# Patient Record
Sex: Female | Born: 1945 | Race: White | Hispanic: No | Smoking: Never smoker
Health system: Southern US, Community
[De-identification: ages and names within clinical notes are randomized; demographics above are authoritative.]

## PROBLEM LIST (undated history)

## (undated) DIAGNOSIS — N289 Disorder of kidney and ureter, unspecified: Secondary | ICD-10-CM

## (undated) DIAGNOSIS — E119 Type 2 diabetes mellitus without complications: Secondary | ICD-10-CM

## (undated) DIAGNOSIS — H5440 Blindness, one eye, unspecified eye: Secondary | ICD-10-CM

## (undated) DIAGNOSIS — E079 Disorder of thyroid, unspecified: Secondary | ICD-10-CM

## (undated) DIAGNOSIS — I10 Essential (primary) hypertension: Secondary | ICD-10-CM

## (undated) HISTORY — PX: THYROID SURGERY: SHX805

## (undated) HISTORY — PX: ABDOMINAL HYSTERECTOMY: SHX81

## (undated) HISTORY — PX: EYE SURGERY: SHX253

---

## 2021-01-19 ENCOUNTER — Emergency Department (HOSPITAL_BASED_OUTPATIENT_CLINIC_OR_DEPARTMENT_OTHER)
Admission: EM | Admit: 2021-01-19 | Discharge: 2021-01-19 | Disposition: A | Discharge status: Home / Self Care | Payer: Medicare PPO | Attending: Emergency Medicine | Admitting: Emergency Medicine

## 2021-01-19 ENCOUNTER — Other Ambulatory Visit: Payer: Self-pay

## 2021-01-19 ENCOUNTER — Emergency Department (HOSPITAL_BASED_OUTPATIENT_CLINIC_OR_DEPARTMENT_OTHER): Payer: Medicare PPO

## 2021-01-19 ENCOUNTER — Encounter (HOSPITAL_BASED_OUTPATIENT_CLINIC_OR_DEPARTMENT_OTHER): Payer: Self-pay

## 2021-01-19 DIAGNOSIS — Y9301 Activity, walking, marching and hiking: Secondary | ICD-10-CM | POA: Insufficient documentation

## 2021-01-19 DIAGNOSIS — T148XXA Other injury of unspecified body region, initial encounter: Secondary | ICD-10-CM

## 2021-01-19 DIAGNOSIS — Z79899 Other long term (current) drug therapy: Secondary | ICD-10-CM | POA: Diagnosis not present

## 2021-01-19 DIAGNOSIS — S4991XA Unspecified injury of right shoulder and upper arm, initial encounter: Secondary | ICD-10-CM | POA: Diagnosis present

## 2021-01-19 DIAGNOSIS — S46911A Strain of unspecified muscle, fascia and tendon at shoulder and upper arm level, right arm, initial encounter: Secondary | ICD-10-CM | POA: Diagnosis not present

## 2021-01-19 DIAGNOSIS — S0081XA Abrasion of other part of head, initial encounter: Secondary | ICD-10-CM | POA: Diagnosis not present

## 2021-01-19 DIAGNOSIS — Z7982 Long term (current) use of aspirin: Secondary | ICD-10-CM | POA: Insufficient documentation

## 2021-01-19 DIAGNOSIS — E119 Type 2 diabetes mellitus without complications: Secondary | ICD-10-CM | POA: Insufficient documentation

## 2021-01-19 DIAGNOSIS — Z794 Long term (current) use of insulin: Secondary | ICD-10-CM | POA: Diagnosis not present

## 2021-01-19 DIAGNOSIS — I10 Essential (primary) hypertension: Secondary | ICD-10-CM | POA: Diagnosis not present

## 2021-01-19 DIAGNOSIS — Y92008 Other place in unspecified non-institutional (private) residence as the place of occurrence of the external cause: Secondary | ICD-10-CM | POA: Diagnosis not present

## 2021-01-19 DIAGNOSIS — W1839XA Other fall on same level, initial encounter: Secondary | ICD-10-CM | POA: Insufficient documentation

## 2021-01-19 DIAGNOSIS — W19XXXA Unspecified fall, initial encounter: Secondary | ICD-10-CM

## 2021-01-19 DIAGNOSIS — R519 Headache, unspecified: Secondary | ICD-10-CM | POA: Diagnosis not present

## 2021-01-19 DIAGNOSIS — Z7984 Long term (current) use of oral hypoglycemic drugs: Secondary | ICD-10-CM | POA: Diagnosis not present

## 2021-01-19 HISTORY — DX: Essential (primary) hypertension: I10

## 2021-01-19 HISTORY — DX: Blindness, one eye, unspecified eye: H54.40

## 2021-01-19 HISTORY — DX: Disorder of thyroid, unspecified: E07.9

## 2021-01-19 HISTORY — DX: Disorder of kidney and ureter, unspecified: N28.9

## 2021-01-19 HISTORY — DX: Type 2 diabetes mellitus without complications: E11.9

## 2021-01-19 MED ORDER — ACETAMINOPHEN 325 MG PO TABS
650.0000 mg | ORAL_TABLET | Freq: Once | ORAL | Status: AC
  Administered 2021-01-19: 650 mg via ORAL
  Filled 2021-01-19: qty 2

## 2021-01-19 NOTE — ED Provider Notes (Signed)
[facility] EMERGENCY DEPARTMENT Provider Note   CSN: [id number] Arrival date & time: 01/19/21  1249     History Chief Complaint  Patient presents with  . Fall    Jessica Best is a 75 y.o. female.  Patient fell while walking her dog today.  Dog pulled on her while she was walking in the dog holding chain with her right hand.  Pain mostly in her right upper arm.  Hit the right side of her face and head when she fell as well.  No loss of consciousness.  Not on blood thinners.  Patient is blind in the right eye.  Denies any shortness of breath.  No lower leg tenderness.  Was ambulatory afterwards.  The history is provided by the patient.  Fall This is a new problem. The current episode started less than 1 hour ago. Pertinent negatives include no chest pain, no abdominal pain, no headaches and no shortness of breath. Exacerbated by: pain. Nothing relieves the symptoms. She has tried nothing for the symptoms. The treatment provided no relief.       Past Medical History:  Diagnosis Date  . Blindness of one eye    right   . Diabetes mellitus without complication (HCC)   . Hypertension   . Renal disorder    one kidney   . Thyroid disease     There are no problems to display for this patient.   Past Surgical History:  Procedure Laterality Date  . ABDOMINAL HYSTERECTOMY    . EYE SURGERY    . THYROID SURGERY       OB History   No obstetric history on file.     No family history on file.  Social History   Tobacco Use  . Smoking status: Never Smoker  . Smokeless tobacco: Never Used  Substance Use Topics  . Alcohol use: Never  . Drug use: Never    Home Medications Prior to Admission medications   Medication Sig Start Date End Date Taking? Authorizing Provider  amLODipine (NORVASC) 5 MG tablet Take 5 mg by mouth daily.   Yes [provider]  aspirin 81 MG chewable tablet Chew by mouth daily.   Yes [provider]  escitalopram  (LEXAPRO) 10 MG tablet Take 10 mg by mouth daily.   Yes [provider]  ezetimibe (ZETIA) 10 MG tablet Take 10 mg by mouth daily.   Yes [provider]  famotidine (PEPCID) 20 MG tablet Take 20 mg by mouth 2 (two) times daily.   Yes [provider]  glipiZIDE (GLUCOTROL XL) 10 MG 24 hr tablet Take 10 mg by mouth daily with breakfast.   Yes [provider]  hydrALAZINE (APRESOLINE) 50 MG tablet Take 50 mg by mouth 3 (three) times daily.   Yes [provider]  levothyroxine (SYNTHROID) 50 MCG tablet Take 50 mcg by mouth daily before breakfast.   Yes [provider]  liraglutide (VICTOZA) 18 MG/3ML SOPN Inject 15 mg into the skin.   Yes [provider]  losartan (COZAAR) 100 MG tablet Take 100 mg by mouth daily.   Yes [provider]  pravastatin (PRAVACHOL) 40 MG tablet Take 40 mg by mouth daily.   Yes [provider]  sodium bicarbonate 650 MG tablet Take 650 mg by mouth 4 (four) times daily.   Yes [provider]    Allergies    Patient has no known allergies.  Review of Systems   Review of Systems  Constitutional: Negative for chills and fever.  HENT: Negative for ear pain and sore throat.   Eyes: Negative for pain and visual disturbance.  Respiratory: Negative for cough and shortness of breath.   Cardiovascular: Negative for chest pain and palpitations.  Gastrointestinal: Negative for abdominal pain and vomiting.  Genitourinary: Negative for dysuria and hematuria.  Musculoskeletal: Positive for arthralgias. Negative for back pain.  Skin: Positive for wound. Negative for color change and rash.  Neurological: Negative for dizziness, seizures, syncope, facial asymmetry, speech difficulty, weakness, light-headedness, numbness and headaches.  All other systems reviewed and are negative.   Physical Exam Updated Vital Signs BP (!) 158/77 (BP Location: Left Arm)   Pulse 76   Temp 98.8 F (37.1 C)  (Oral)   Resp 16   Ht 5\' 4"  (1.626 m)   Wt 95.3 kg   SpO2 98%   BMI 36.05 kg/m   Physical Exam Vitals and nursing note reviewed.  Constitutional:      General: She is not in acute distress.    Appearance: She is well-developed and well-nourished. She is not ill-appearing.  HENT:     Head: Normocephalic and atraumatic.     Nose: Nose normal.  Eyes:     Conjunctiva/sclera: Conjunctivae normal.     Comments: Blind in the right eye, pupils equal and reactive on the left  Cardiovascular:     Rate and Rhythm: Normal rate and regular rhythm.     Pulses: Normal pulses.     Heart sounds: Normal heart sounds. No murmur heard.   Pulmonary:     Effort: Pulmonary effort is normal. No respiratory distress.     Breath sounds: Normal breath sounds.  Abdominal:     Palpations: Abdomen is soft.     Tenderness: There is no abdominal tenderness.  Musculoskeletal:        General: Tenderness present. No swelling, deformity or edema.     Cervical back: Normal range of motion and neck supple. No tenderness.     Comments: No midline spinal tenderness, tenderness to the right shoulder with decreased range of motion with abduction in of the right upper arm  Skin:    General: Skin is warm and dry.     Capillary Refill: Capillary refill takes less than 2 seconds.     Comments: Abrasions to right side of the face  Neurological:     General: No focal deficit present.     Mental Status: She is alert and oriented to person, place, and time.     Cranial Nerves: No cranial nerve deficit.     Sensory: No sensory deficit.     Motor: No weakness.  Psychiatric:        Mood and Affect: Mood and affect normal.     ED Results / Procedures / Treatments   Labs (all labs ordered are listed, but only abnormal results are displayed) Labs Reviewed - No data to display  EKG None  Radiology DG Shoulder Right  Result Date: 01/19/2021 CLINICAL DATA:  Onset right shoulder pain today when a dog she was  walking jerked the patient's shoulder. Initial encounter. EXAM: RIGHT SHOULDER - 2+ VIEW COMPARISON:  None. FINDINGS: There is no acute bony or joint abnormality. 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 Glenohumeral joint appears normal. Calcific rotator cuff tendinopathy noted. IMPRESSION: No acute abnormality. Calcific rotator cuff tendinopathy. Moderate acromioclavicular osteoarthritis. Electronically Signed   By: Drusilla Kannerhomas  Dalessio M.D.   On: 01/19/2021 14:12   CT Head Wo Contrast  Result Date: 01/19/2021 CLINICAL DATA:  Head trauma. EXAM: CT HEAD WITHOUT CONTRAST CT MAXILLOFACIAL WITHOUT CONTRAST CT CERVICAL SPINE WITHOUT CONTRAST TECHNIQUE: Multidetector CT imaging of the head, cervical spine, and maxillofacial structures were performed using the standard protocol without intravenous contrast. Multiplanar CT image reconstructions of the cervical spine and maxillofacial structures were also generated. COMPARISON:  None. FINDINGS: CT HEAD FINDINGS Brain: Bifrontal atrophy. Ventricle size normal. Negative for acute infarct, hemorrhage, mass. Benign-appearing calcification in the subarachnoid space over the left convexity. Vascular: Negative for hyperdense vessel Skull: Negative Other: None CT MAXILLOFACIAL FINDINGS Osseous: Negative for fracture. Degenerative change in the TMJ bilaterally. Orbits: Paranasal sinuses clear. Contracted and calcified right globe. Left globe intact with left cataract extraction Sinuses: Clear Soft tissues: Negative for soft tissue swelling or hematoma. CT CERVICAL SPINE FINDINGS Alignment: Mild anterolisthesis C4-5 Skull base and vertebrae: Negative for fracture Soft tissues and spinal canal: Atherosclerotic calcification carotid artery bilaterally. Coarse calcifications in the thyroid bilaterally. 1 cm left thyroid nodule. No further imaging necessary. Disc levels: Disc degeneration and spurring most prominent at C5-6 and C6-7. Mild left foraminal narrowing C5-6 and  C6-7. Upper chest: Lung apices clear bilaterally. Other: None IMPRESSION: 1. No acute intracranial abnormality 2. Negative for facial fracture 3. Negative for cervical spine fracture. Electronically Signed   By: Marlan Palau M.D.   On: 01/19/2021 14:11   CT Cervical Spine Wo Contrast  Result Date: 01/19/2021 CLINICAL DATA:  Head trauma. EXAM: CT HEAD WITHOUT CONTRAST CT MAXILLOFACIAL WITHOUT CONTRAST CT CERVICAL SPINE WITHOUT CONTRAST TECHNIQUE: Multidetector CT imaging of the head, cervical spine, and maxillofacial structures were performed using the standard protocol without intravenous contrast. Multiplanar CT image reconstructions of the cervical spine and maxillofacial structures were also generated. COMPARISON:  None. FINDINGS: CT HEAD FINDINGS Brain: Bifrontal atrophy. Ventricle size normal. Negative for acute infarct, hemorrhage, mass. Benign-appearing calcification in the subarachnoid space over the left convexity. Vascular: Negative for hyperdense vessel Skull: Negative Other: None CT MAXILLOFACIAL FINDINGS Osseous: Negative for fracture. Degenerative change in the TMJ bilaterally. Orbits: Paranasal sinuses clear. Contracted and calcified right globe. Left globe intact with left cataract extraction Sinuses: Clear Soft tissues: Negative for soft tissue swelling or hematoma. CT CERVICAL SPINE FINDINGS Alignment: Mild anterolisthesis C4-5 Skull base and vertebrae: Negative for fracture Soft tissues and spinal canal: Atherosclerotic calcification carotid artery bilaterally. Coarse calcifications in the thyroid bilaterally. 1 cm left thyroid nodule. No further imaging necessary. Disc levels: Disc degeneration and spurring most prominent at C5-6 and C6-7. Mild left foraminal narrowing C5-6 and C6-7. Upper chest: Lung apices clear bilaterally. Other: None IMPRESSION: 1. No acute intracranial abnormality 2. Negative for facial fracture 3. Negative for cervical spine fracture. Electronically Signed   By:  Marlan Palau M.D.   On: 01/19/2021 14:11   CT Maxillofacial Wo Contrast  Result Date: 01/19/2021 CLINICAL DATA:  Head trauma. EXAM: CT HEAD WITHOUT CONTRAST CT MAXILLOFACIAL WITHOUT CONTRAST CT CERVICAL SPINE WITHOUT CONTRAST TECHNIQUE: Multidetector CT imaging of the head, cervical spine, and maxillofacial structures were performed using the standard protocol without intravenous contrast. Multiplanar CT image reconstructions of the cervical spine and maxillofacial structures were also generated. COMPARISON:  None. FINDINGS: CT HEAD FINDINGS Brain: Bifrontal atrophy. Ventricle size normal. Negative for acute infarct, hemorrhage, mass. Benign-appearing calcification in the subarachnoid space over the left convexity. Vascular: Negative for hyperdense vessel Skull: Negative Other: None CT MAXILLOFACIAL FINDINGS Osseous: Negative for fracture. Degenerative change in the TMJ bilaterally. Orbits: Paranasal sinuses clear. Contracted and calcified right globe.  Left globe intact with left cataract extraction Sinuses: Clear Soft tissues: Negative for soft tissue swelling or hematoma. CT CERVICAL SPINE FINDINGS Alignment: Mild anterolisthesis C4-5 Skull base and vertebrae: Negative for fracture Soft tissues and spinal canal: Atherosclerotic calcification carotid artery bilaterally. Coarse calcifications in the thyroid bilaterally. 1 cm left thyroid nodule. No further imaging necessary. Disc levels: Disc degeneration and spurring most prominent at C5-6 and C6-7. Mild left foraminal narrowing C5-6 and C6-7. Upper chest: Lung apices clear bilaterally. Other: None IMPRESSION: 1. No acute intracranial abnormality 2. Negative for facial fracture 3. Negative for cervical spine fracture. Electronically Signed   By: Marlan Palau M.D.   On: 01/19/2021 14:11    Procedures Procedures   Medications Ordered in ED Medications  acetaminophen (TYLENOL) tablet 650 mg (650 mg Oral Given 01/19/21 1359)    ED Course  I have  reviewed the triage vital signs and the nursing notes.  Pertinent labs & imaging results that were available during my care of the patient were reviewed by me and considered in my medical decision making (see chart for details).    MDM Rules/Calculators/A&P                          Jessica Best is a 75 year old female who presents to the ED after a fall.  Normal vitals.  No fever.  Patient fell while walking her dog who pulled on leash.  Pain mostly in the right side of the face, head, right shoulder.  No obvious deformity.  Shoulder appears located on exam.  Could be rotator cuff injury versus proximal humerus fracture.  Will get x-ray of the shoulder as well as CT scan of the head, face, neck.  She has no midline spinal tenderness.  No lower extremity tenderness or pelvis instability.  No abdominal pain.  She is not on blood thinners.  Will give Tylenol.  Has had issues with the right shoulder in the past that has required physical therapy as well.  Neurologically she is intact  X-ray of the right shoulder negative for fracture. CT scan of the head, face, neck unremarkable for acute injuries as well. Overall suspect may be soft tissue injury to the right shoulder, possibly rotator cuff injury although she has had some significant issues with the right shoulder in the past and has some arthritis changes on x-ray. We will place her in a sling. We will have her follow-up with sports medicine. Recommend Tylenol and ice for pain.  This chart was dictated using voice recognition software.  Despite best efforts to proofread,  errors can occur which can change the documentation meaning.   Final Clinical Impression(s) / ED Diagnoses Final diagnoses:  Fall, initial encounter  Abrasion  Strain of right shoulder, initial encounter    Rx / DC Orders ED Discharge Orders    None       Virgina Norfolk, DO 01/19/21 1418

## 2021-01-19 NOTE — Discharge Instructions (Addendum)
Wear sling for comfort. It is okay to remove the sling for showers and for range of motion exercises. Recommend ice and Tylenol for pain. Follow-up with your primary care doctor or sports medicine doctor as discussed. Suspect that you will need physical therapy.

## 2021-01-19 NOTE — ED Triage Notes (Signed)
Pt arrives with daughter who reports a neighbor saw her fall r/t a dog pulling her down off of the porch. Pt has bruise to right face, and pain to right shoulder, but fell on left shoulder. Denies blood thinner, denies LOC.

## 2021-11-14 IMAGING — CT CT HEAD W/O CM
3 series · 15 of 47 positions shown, 18 images · non-contrast
Comparison: None.

CLINICAL DATA: Head trauma.

EXAM:
CT HEAD WITHOUT CONTRAST
CT MAXILLOFACIAL WITHOUT CONTRAST
CT CERVICAL SPINE WITHOUT CONTRAST
TECHNIQUE: Multidetector CT imaging of the head, cervical spine, and
maxillofacial structures were performed using the standard protocol
without intravenous contrast. Multiplanar CT image reconstructions
of the cervical spine and maxillofacial structures were also
generated.

[Series 2: head wo · axial · 0.45mm/px · z∈[-184,-44]mm · 9 of 34 slices shown, 12 images]
[im 3/34  brain]
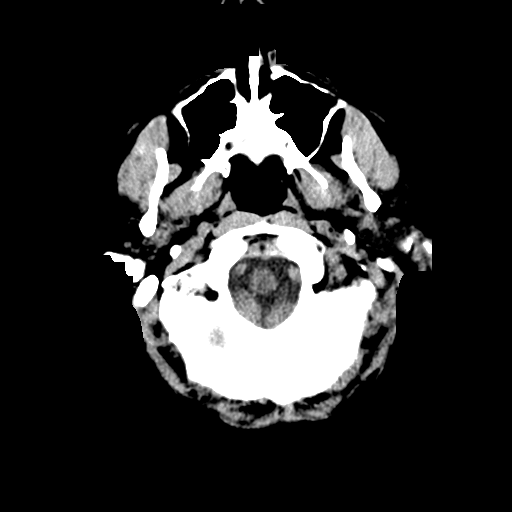
[im 3/34  bone]
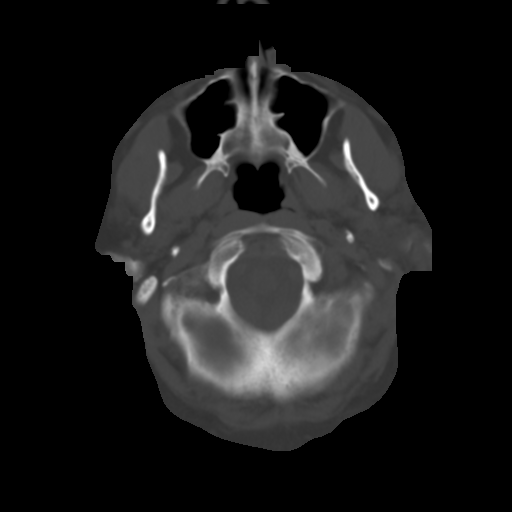
[im 6/34  brain]
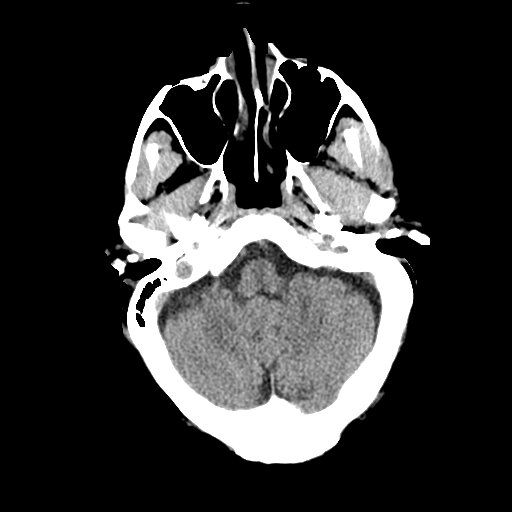
[im 10/34  brain]
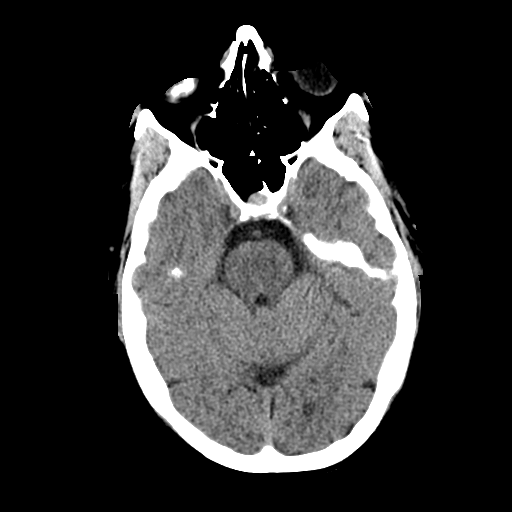
[im 13/34  brain]
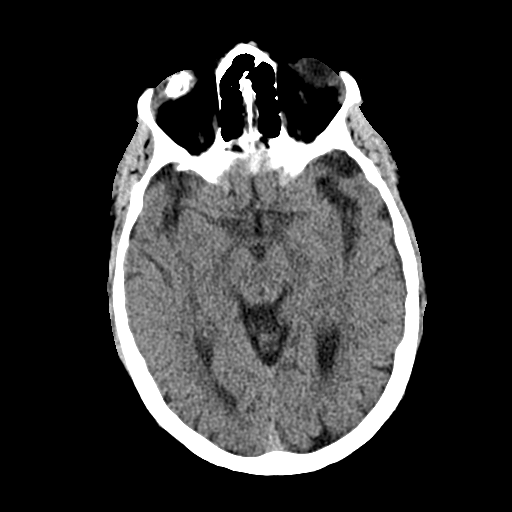
[im 18/34  brain]
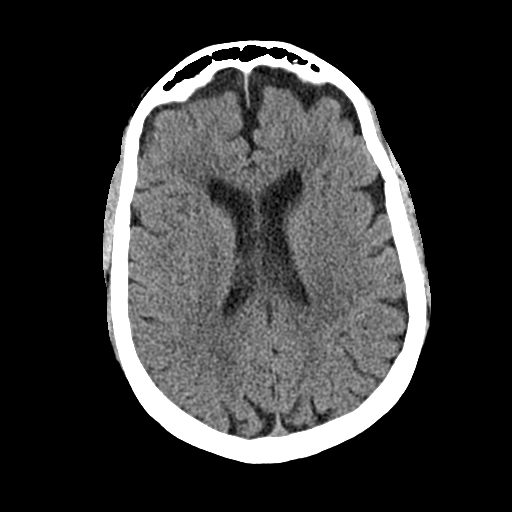
[im 18/34  bone]
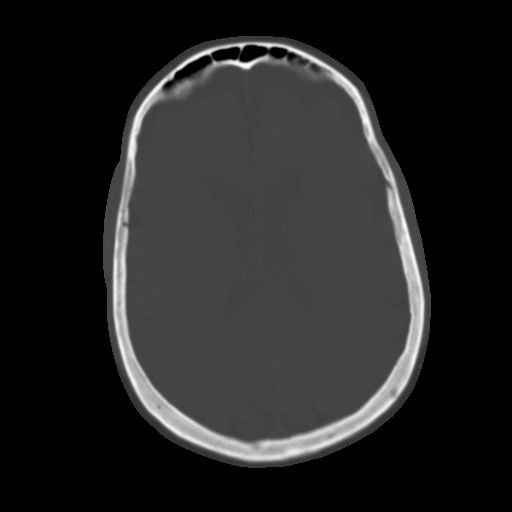
[im 21/34  brain]
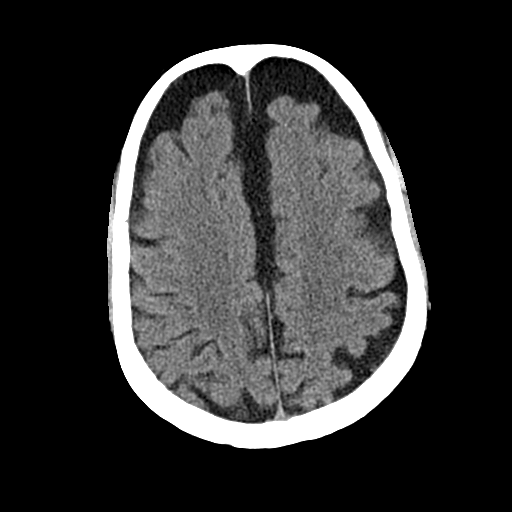
[im 24/34  brain]
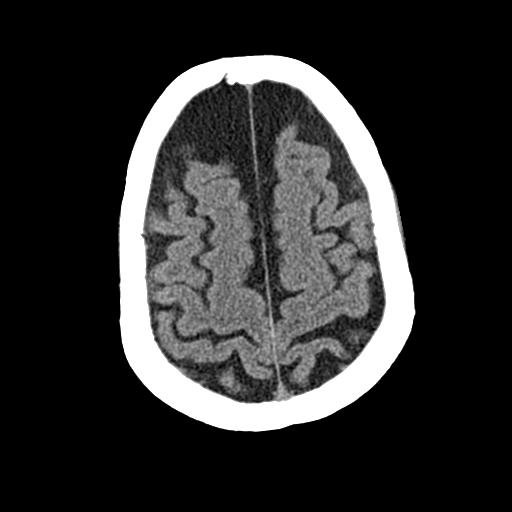
[im 28/34  brain]
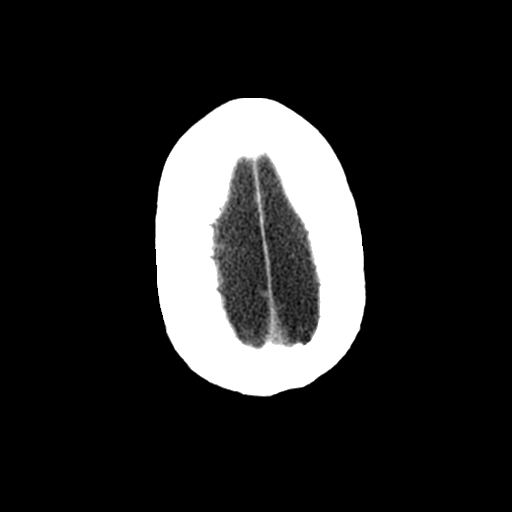
[im 31/34  brain]
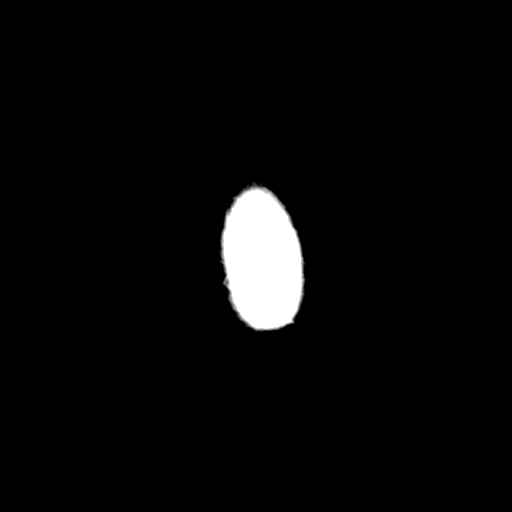
[im 31/34  bone]
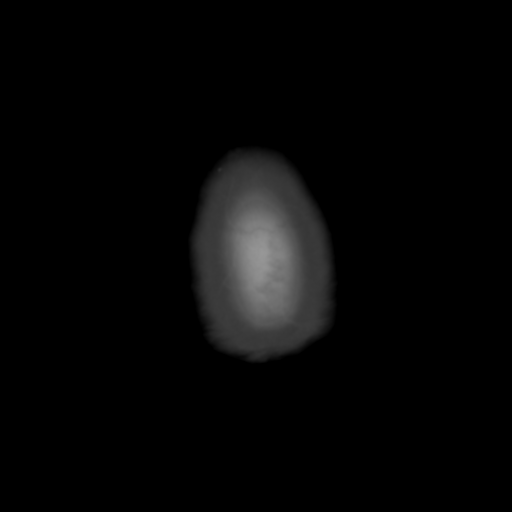

[Series 4: head coronal · coronal · 0.33mm/px · 3 of 71 slices shown]
[im 24/71  brain]
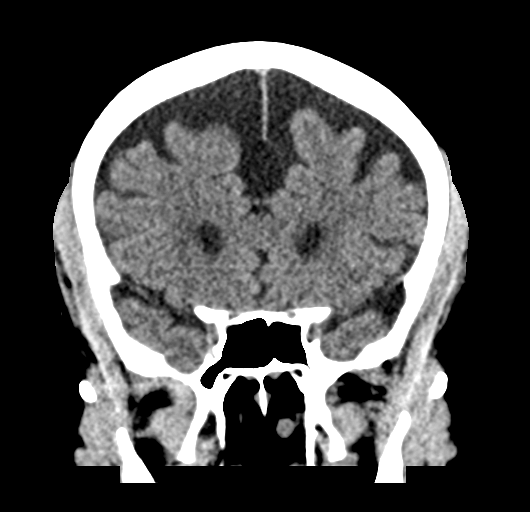
[im 32/71  brain]
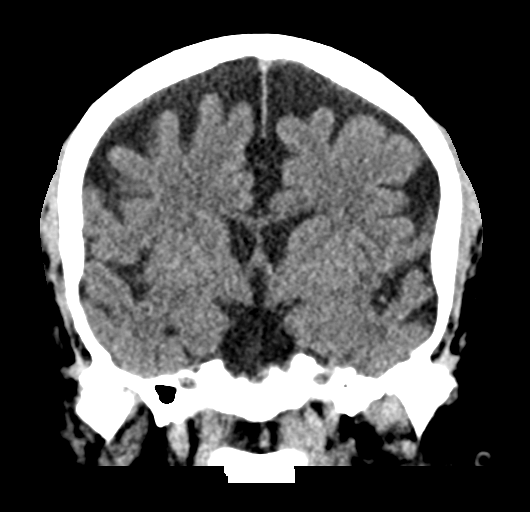
[im 39/71  brain]
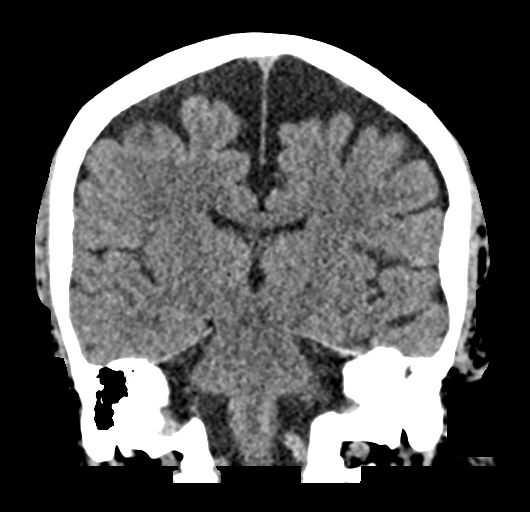

[Series 5: head sagittal · sagittal · 0.34mm/px · 3 of 56 slices shown]
[im 19/56  brain]
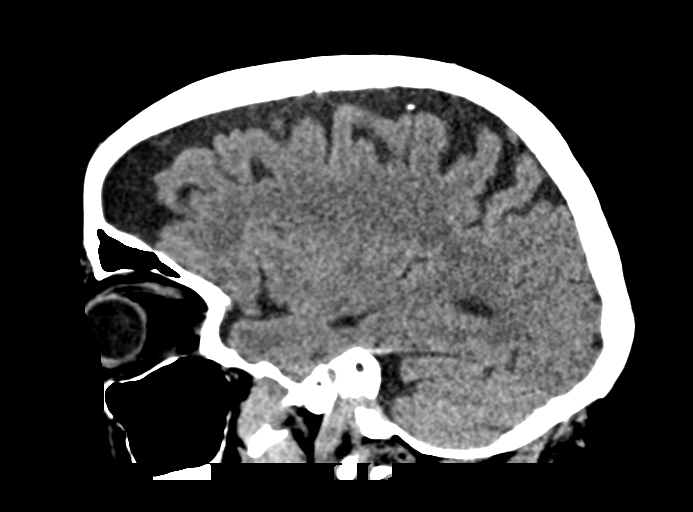
[im 28/56  brain]
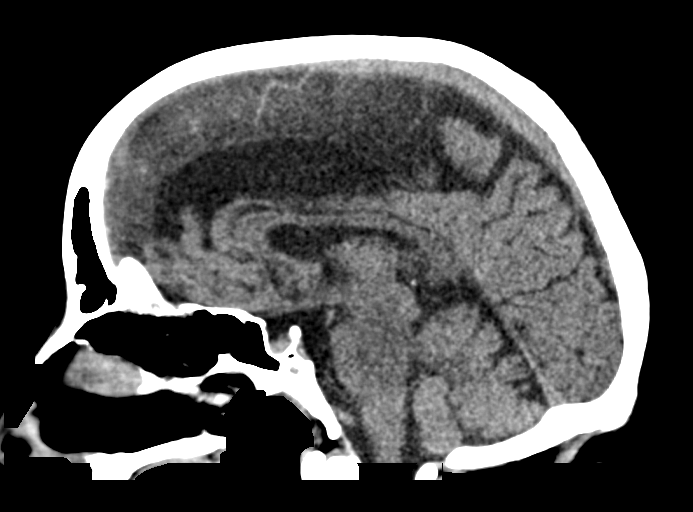
[im 37/56  brain]
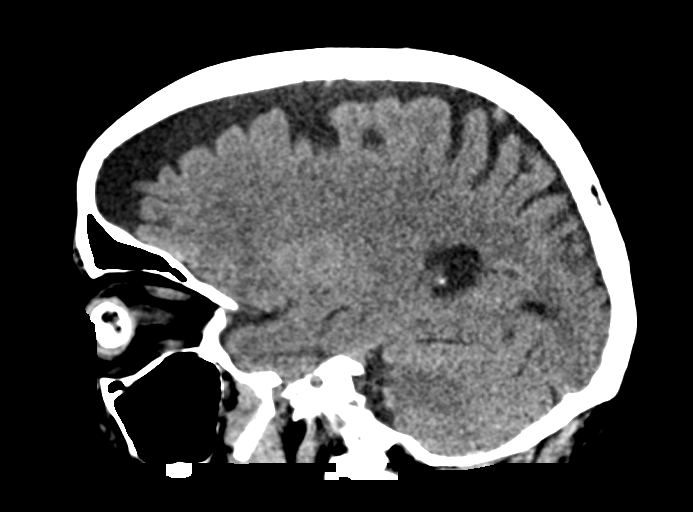

[15 of 47 positions shown; findings below may reference images not displayed]

FINDINGS: CT HEAD FINDINGS

Brain: Bifrontal atrophy. Ventricle size normal. Negative for acute
infarct, hemorrhage, mass. Benign-appearing calcification in the
subarachnoid space over the left convexity.

Vascular: Negative for hyperdense vessel

Skull: Negative

Other: None

CT MAXILLOFACIAL FINDINGS

Osseous: Negative for fracture. Degenerative change in the TMJ
bilaterally.

Orbits: Paranasal sinuses clear. Contracted and calcified right
globe. Left globe intact with left cataract extraction

Sinuses: Clear

Soft tissues: Negative for soft tissue swelling or hematoma.

CT CERVICAL SPINE FINDINGS

Alignment: Mild anterolisthesis C4-5

Skull base and vertebrae: Negative for fracture

Soft tissues and spinal canal: Atherosclerotic calcification carotid
artery bilaterally. Coarse calcifications in the thyroid
bilaterally. 1 cm left thyroid nodule. No further imaging necessary.

Disc levels: Disc degeneration and spurring most prominent at C5-6
and C6-7. Mild left foraminal narrowing C5-6 and C6-7.

Upper chest: Lung apices clear bilaterally.

Other: None
IMPRESSION: 1. No acute intracranial abnormality
2. Negative for facial fracture
3. Negative for cervical spine fracture.

## 2021-11-14 IMAGING — DX DG SHOULDER 2+V*R*
3 series · 3 of 3 positions shown · non-contrast
Comparison: None.

CLINICAL DATA: Onset right shoulder pain today when a dog she was
walking jerked the patient's shoulder. Initial encounter.

EXAM:
RIGHT SHOULDER - 2+ VIEW

[shoulder y view]
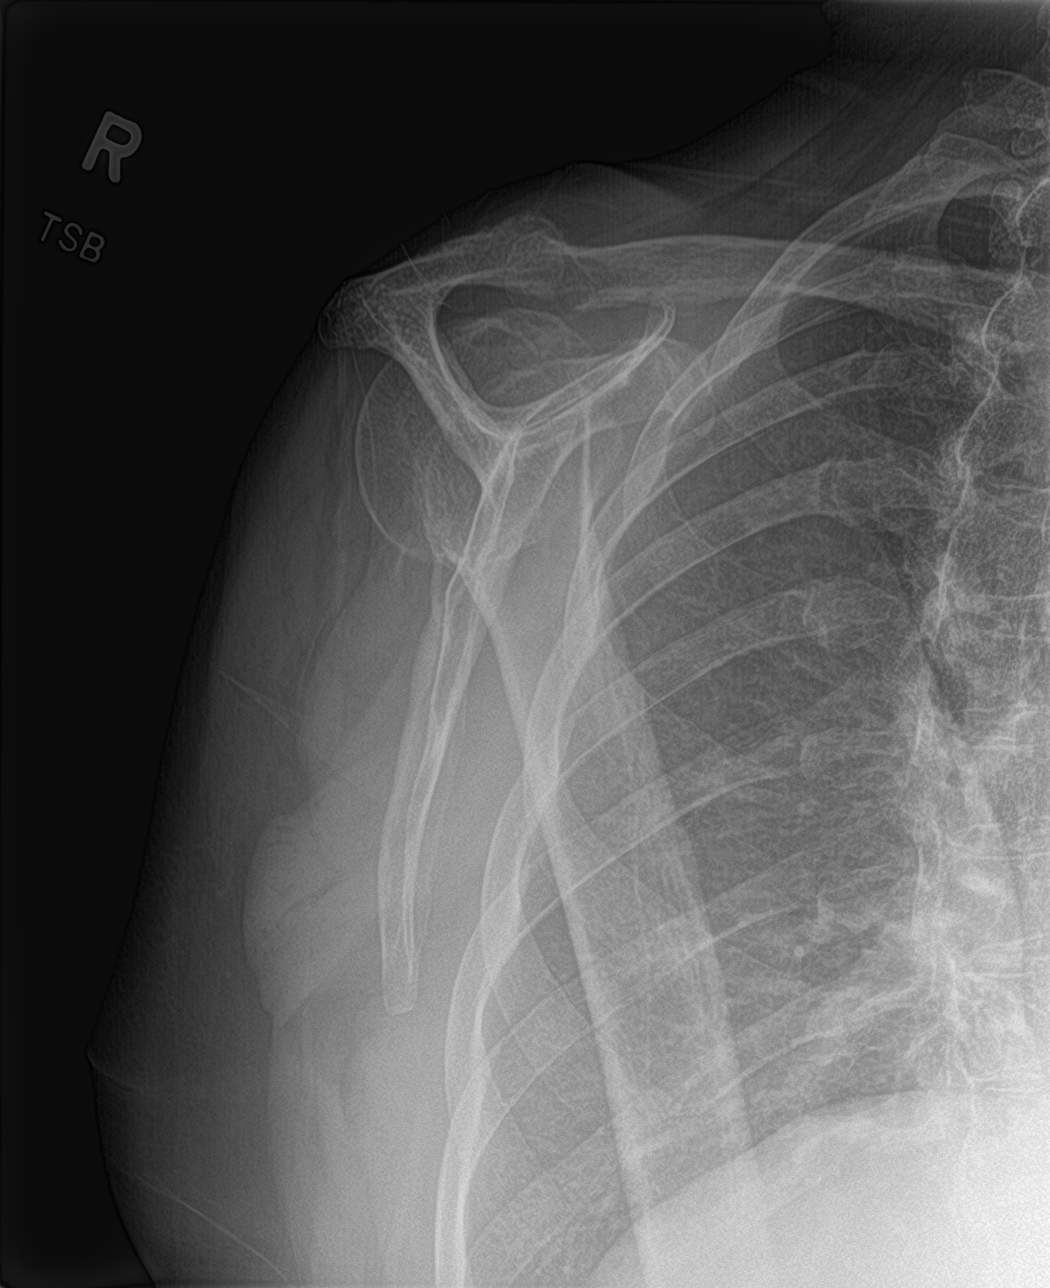

[shoulder ap neutral]
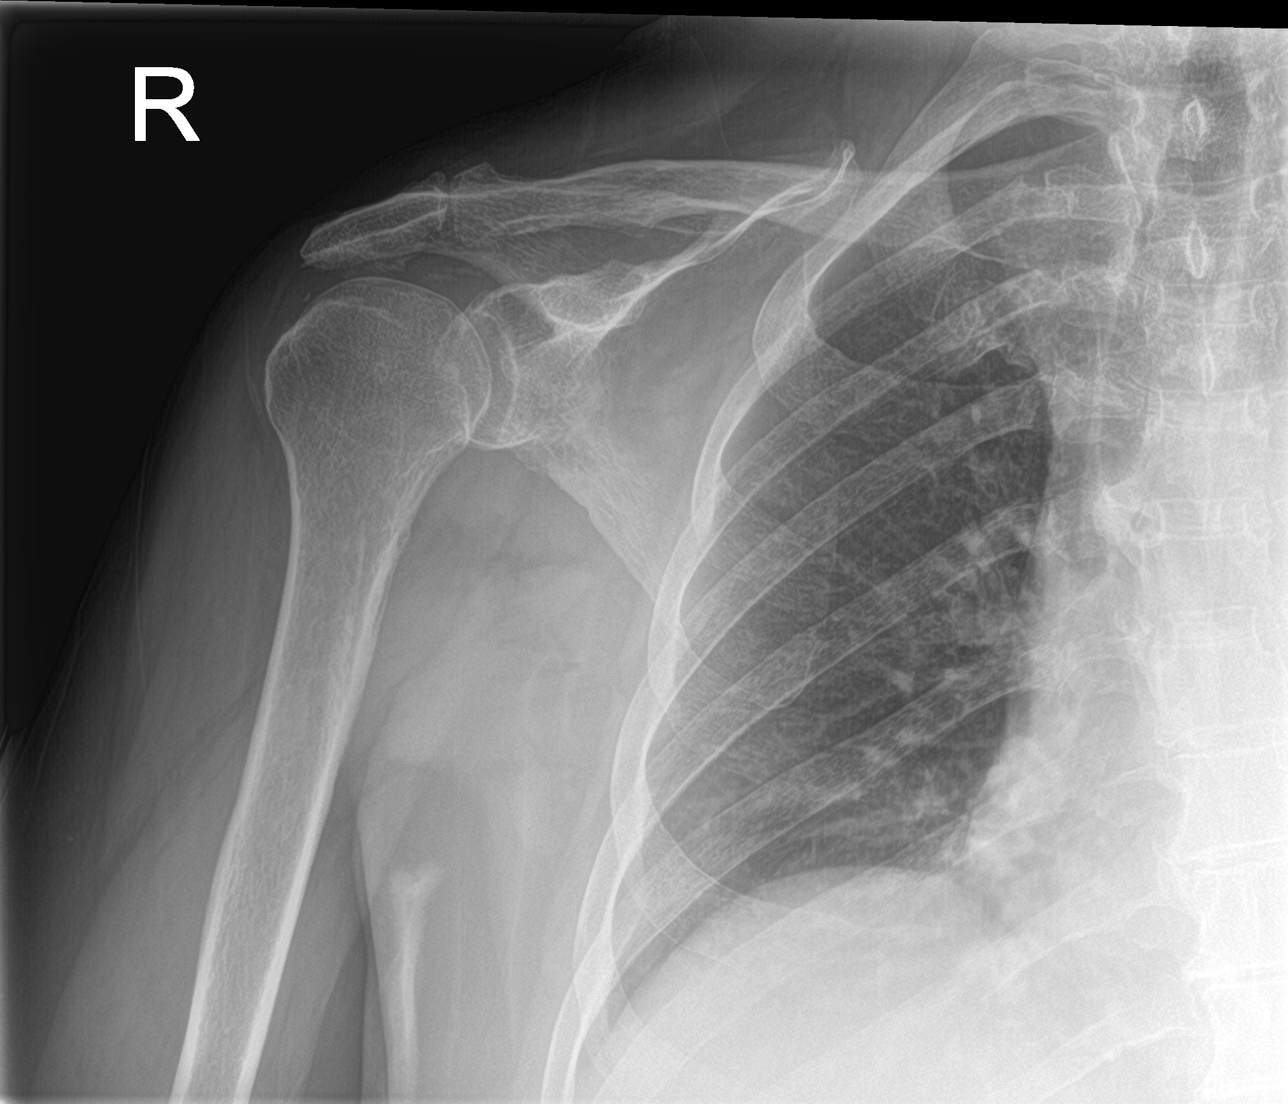

[shoulder grashey]
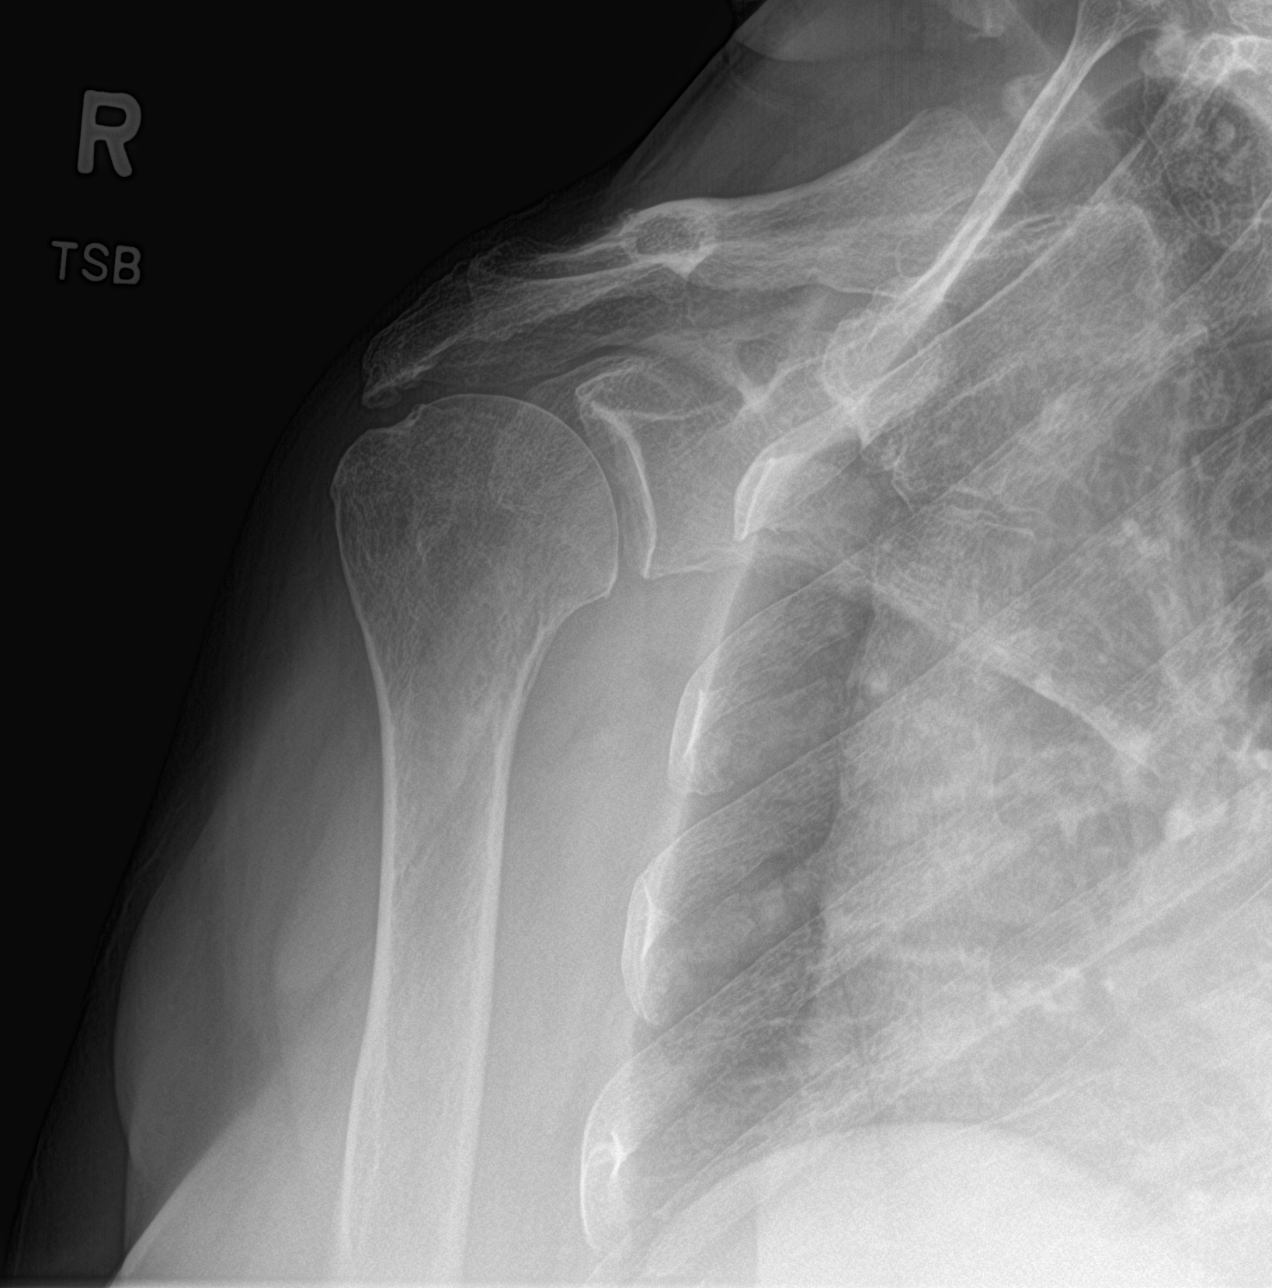

[3 of 3 positions shown; findings below may reference images not displayed]

FINDINGS: There is no acute bony or joint abnormality. Moderate
acromioclavicular osteoarthritis is seen. Glenohumeral joint appears
normal. Calcific rotator cuff tendinopathy noted.
IMPRESSION: No acute abnormality.

Calcific rotator cuff tendinopathy.

Moderate acromioclavicular osteoarthritis.
